# Patient Record
Sex: Male | Born: 1955 | Race: Black or African American | Hispanic: No | Marital: Single | Smoking: Current some day smoker
Health system: Southern US, Community
[De-identification: ages and names within clinical notes are randomized; demographics above are authoritative.]

## PROBLEM LIST (undated history)

## (undated) DIAGNOSIS — N23 Unspecified renal colic: Secondary | ICD-10-CM

## (undated) DIAGNOSIS — K219 Gastro-esophageal reflux disease without esophagitis: Secondary | ICD-10-CM

## (undated) DIAGNOSIS — K589 Irritable bowel syndrome without diarrhea: Secondary | ICD-10-CM

## (undated) DIAGNOSIS — M79609 Pain in unspecified limb: Principal | ICD-10-CM

## (undated) HISTORY — PX: KNEE SURGERY: SHX244

## (undated) HISTORY — PX: ANTERIOR CERVICAL DECOMP/DISCECTOMY FUSION: SHX1161

## (undated) HISTORY — DX: Pain in unspecified limb: M79.609

---

## 1999-10-26 ENCOUNTER — Other Ambulatory Visit: Admission: RE | Admit: 1999-10-26 | Discharge: 1999-10-26 | Payer: Self-pay | Admitting: Gastroenterology

## 2002-05-26 ENCOUNTER — Ambulatory Visit (HOSPITAL_BASED_OUTPATIENT_CLINIC_OR_DEPARTMENT_OTHER): Admission: RE | Admit: 2002-05-26 | Discharge: 2002-05-26 | Payer: Self-pay | Admitting: Urology

## 2006-02-28 ENCOUNTER — Ambulatory Visit (HOSPITAL_COMMUNITY): Admission: RE | Admit: 2006-02-28 | Discharge: 2006-02-28 | Payer: Self-pay | Admitting: Neurosurgery

## 2006-03-12 ENCOUNTER — Ambulatory Visit (HOSPITAL_COMMUNITY): Admission: RE | Admit: 2006-03-12 | Discharge: 2006-03-13 | Payer: Self-pay | Admitting: Neurosurgery

## 2006-05-09 ENCOUNTER — Encounter: Admission: RE | Admit: 2006-05-09 | Discharge: 2006-05-09 | Payer: Self-pay | Admitting: Neurosurgery

## 2010-12-15 ENCOUNTER — Emergency Department (HOSPITAL_COMMUNITY): Payer: BC Managed Care – PPO

## 2010-12-15 ENCOUNTER — Emergency Department (HOSPITAL_COMMUNITY)
Admission: EM | Admit: 2010-12-15 | Discharge: 2010-12-15 | Disposition: A | Discharge status: Home / Self Care | Payer: BC Managed Care – PPO | Attending: Emergency Medicine | Admitting: Emergency Medicine

## 2010-12-15 DIAGNOSIS — K219 Gastro-esophageal reflux disease without esophagitis: Secondary | ICD-10-CM | POA: Insufficient documentation

## 2010-12-15 DIAGNOSIS — R112 Nausea with vomiting, unspecified: Secondary | ICD-10-CM | POA: Insufficient documentation

## 2010-12-15 DIAGNOSIS — R109 Unspecified abdominal pain: Secondary | ICD-10-CM

## 2010-12-15 DIAGNOSIS — M549 Dorsalgia, unspecified: Secondary | ICD-10-CM | POA: Insufficient documentation

## 2010-12-15 DIAGNOSIS — K589 Irritable bowel syndrome without diarrhea: Secondary | ICD-10-CM | POA: Insufficient documentation

## 2010-12-15 HISTORY — DX: Irritable bowel syndrome without diarrhea: K58.9

## 2010-12-15 HISTORY — DX: Unspecified renal colic: N23

## 2010-12-15 HISTORY — DX: Gastro-esophageal reflux disease without esophagitis: K21.9

## 2010-12-15 LAB — CBC
HCT: 38.1 % — ABNORMAL LOW (ref 39.0–52.0)
Hemoglobin: 12.9 g/dL — ABNORMAL LOW (ref 13.0–17.0)
MCHC: 33.9 g/dL (ref 30.0–36.0)
MCV: 100.5 fL — ABNORMAL HIGH (ref 78.0–100.0)
Platelets: 230 10*3/uL (ref 150–400)
RBC: 3.79 MIL/uL — ABNORMAL LOW (ref 4.22–5.81)
RDW: 12.9 % (ref 11.5–15.5)
WBC: 8.5 10*3/uL (ref 4.0–10.5)

## 2010-12-15 LAB — DIFFERENTIAL
Basophils Absolute: 0.1 10*3/uL (ref 0.0–0.1)
Eosinophils Absolute: 0.4 10*3/uL (ref 0.0–0.7)
Eosinophils Relative: 4 % (ref 0–5)
Monocytes Absolute: 0.6 10*3/uL (ref 0.1–1.0)
Monocytes Relative: 7 % (ref 3–12)
Neutrophils Relative %: 60 % (ref 43–77)

## 2010-12-15 LAB — URINALYSIS, ROUTINE W REFLEX MICROSCOPIC
Bilirubin Urine: NEGATIVE
Glucose, UA: NEGATIVE mg/dL
Leukocytes, UA: NEGATIVE
Specific Gravity, Urine: 1.016 (ref 1.005–1.030)
pH: 5.5 (ref 5.0–8.0)

## 2010-12-15 LAB — BASIC METABOLIC PANEL
CO2: 25 mEq/L (ref 19–32)
Calcium: 8.9 mg/dL (ref 8.4–10.5)
Chloride: 104 mEq/L (ref 96–112)
GFR calc non Af Amer: >90 mL/min (ref >90)
Glucose, Bld: 97 mg/dL (ref 70–99)
Potassium: 3.6 mEq/L (ref 3.5–5.1)

## 2010-12-15 LAB — URINE MICROSCOPIC-ADD ON

## 2010-12-15 MED ORDER — FAMOTIDINE 20 MG PO TABS
20.0000 mg | ORAL_TABLET | Freq: Once | ORAL | Status: AC
  Administered 2010-12-15: 20 mg via ORAL
  Filled 2010-12-15: qty 1

## 2010-12-15 MED ORDER — OXYCODONE-ACETAMINOPHEN 5-325 MG PO TABS: 1.0000 | ORAL_TABLET | ORAL | Status: AC | PRN

## 2010-12-15 MED ORDER — KETOROLAC TROMETHAMINE 30 MG/ML IJ SOLN
30.0000 mg | Freq: Once | INTRAMUSCULAR | Status: AC
  Administered 2010-12-15: 30 mg via INTRAVENOUS
  Filled 2010-12-15: qty 1

## 2010-12-15 MED ORDER — ONDANSETRON HCL 4 MG/2ML IJ SOLN
4.0000 mg | Freq: Once | INTRAMUSCULAR | Status: AC
  Administered 2010-12-15: 4 mg via INTRAVENOUS
  Filled 2010-12-15: qty 2

## 2010-12-15 MED ORDER — MORPHINE SULFATE 4 MG/ML IJ SOLN
4.0000 mg | Freq: Once | INTRAMUSCULAR | Status: AC
  Administered 2010-12-15: 4 mg via INTRAVENOUS
  Filled 2010-12-15: qty 1

## 2010-12-15 MED ORDER — HYDROMORPHONE HCL PF 2 MG/ML IJ SOLN
INTRAMUSCULAR | Status: AC
  Administered 2010-12-15: 1 mg
  Filled 2010-12-15: qty 1

## 2010-12-15 MED ORDER — PROMETHAZINE HCL 25 MG PO TABS: 25.0000 mg | ORAL_TABLET | Freq: Four times a day (QID) | ORAL | Status: AC | PRN

## 2010-12-15 NOTE — ED Notes (Signed)
Patient states that since Sunday he has been having right lower quadrant/flank pain. He went to pcp office and was sent home with percocet. He has been taking this with no relief. Last dose of pain med was at 0700. Pt called pcp and he was sent in here for evaluation of persistent right abd pain and flank pain. Pt called [facility] yesterday but they were unable to see him. The patient tried to wait the night out and was going to call office again this morning but he couldn't tolerate the pain.

## 2010-12-15 NOTE — ED Notes (Signed)
Patient transported to CT 

## 2010-12-15 NOTE — ED Provider Notes (Signed)
Medical screening examination/treatment/procedure(s) were performed by non-physician practitioner and as supervising physician I was immediately available for consultation/collaboration.   Barnell Shieh, MD 12/15/10 1321 

## 2010-12-15 NOTE — ED Notes (Signed)
See triage note. Pt started to have pain on Sunday. He went to pcp office who did a renal colic work up with a 2-view kub that did not show an obvious kidney stone. He was sent home with percocet and tried to get into [facility] yesterday but they were full. He tried to get through the night but this morning his right lower quadrant abdominal/flank pain was worse. Denies any vomiting although he has been nauseated at times. Iv started and labs obtained. Protocols initiated. Family at the bedside. Pt states that the last kidney stone was passed and did not require stents or lithotripsy.

## 2010-12-15 NOTE — ED Provider Notes (Signed)
History     CSN: [id number] Arrival date & time: 12/15/2010  8:15 AM   None     Chief Complaint  Patient presents with  . Abdominal Pain  . Flank Pain   HPI Patient presents to emergency room with complaint of 2 days of right sided flank pain that radiates into his back. Reports that it also radiates into his groin. Denies any trauma or falls. Reports some nausea without vomiting. Denies any fevers. Reports a past medical history significant for kidney stone seen by Dr. Fayrene Helper in the past. Patient reports that he has had to have surgery secondary to kidney stones. Patient denies any other complaints.  Past Medical History  Diagnosis Date  . Renal colic   . IBS (irritable bowel syndrome)   . Acid reflux     Past Surgical History  Procedure Date  . Anterior cervical decomp/discectomy fusion   . Knee surgery     History reviewed. No pertinent family history.  History  Substance Use Topics  . Smoking status: Current Some Day Smoker  . Smokeless tobacco: Never Used  . Alcohol Use: 1.8 oz/week    3 Cans of beer per week      Review of Systems  Constitutional: Negative for fever, chills, diaphoresis and appetite change.  HENT: Negative for neck pain.   Eyes: Negative for photophobia and visual disturbance.  Respiratory: Negative for cough, chest tightness and shortness of breath.   Cardiovascular: Negative for chest pain.  Gastrointestinal: Positive for nausea and abdominal pain. Negative for vomiting, diarrhea, constipation, blood in stool and anal bleeding.  Genitourinary: Negative for dysuria, urgency, flank pain, decreased urine volume, discharge, penile swelling, scrotal swelling, difficulty urinating, penile pain and testicular pain.  Musculoskeletal: Negative for back pain.  Skin: Negative for rash.  Neurological: Negative for weakness and numbness.  All other systems reviewed and are negative.    Allergies  Aspirin and Shellfish allergy  Home Medications    No current outpatient prescriptions on file.  BP 138/84  Pulse 64  Temp(Src) 98.5 F (36.9 C) (Oral)  SpO2 100%  Physical Exam  Nursing note and vitals reviewed. Constitutional: He is oriented to person, place, and time. He appears well-developed and well-nourished. No distress.  HENT:  Head: Normocephalic and atraumatic.  Eyes: EOM are normal. Pupils are equal, round, and reactive to light.  Neck: Normal range of motion. Neck supple.  Abdominal: Bowel sounds are normal. He exhibits no distension and no mass. There is no rebound and no guarding. Hernia confirmed negative in the right inguinal area and confirmed negative in the left inguinal area.       Minimal cva and right flank pain  Genitourinary: Penis normal. Right testis shows no swelling and no tenderness. Left testis shows no swelling and no tenderness. Circumcised. No penile tenderness.       Chaperone present during examination.  Musculoskeletal: Normal range of motion.  Neurological: He is alert and oriented to person, place, and time.  Skin: Skin is dry. He is not diaphoretic.  Psychiatric: He has a normal mood and affect. His behavior is normal. Judgment and thought content normal.    ED Course  Procedures (including critical care time)  Patient seen and evaluated.  VSS reviewed. . Nursing notes reviewed. Discussed with attending physician, dr. Manus Gunning. Initial testing ordered. Will monitor the patient closely. They agree with the treatment plan and diagnosis.   Results for orders placed during the hospital encounter of 12/15/10  BASIC METABOLIC PANEL  Component Value Range   Sodium 140  135 - 145 (mEq/L)   Potassium 3.6  3.5 - 5.1 (mEq/L)   Chloride 104  96 - 112 (mEq/L)   CO2 25  19 - 32 (mEq/L)   Glucose, Bld 97  70 - 99 (mg/dL)   BUN 7  6 - 23 (mg/dL)   Creatinine, Ser 1.61  0.50 - 1.35 (mg/dL)   Calcium 8.9  8.4 - 09.6 (mg/dL)   GFR calc non Af Amer >90  >90 (mL/min)   GFR calc Af Amer >90  >90  (mL/min)  CBC      Component Value Range   WBC 8.5  4.0 - 10.5 (K/uL)   RBC 3.79 (*) 4.22 - 5.81 (MIL/uL)   Hemoglobin 12.9 (*) 13.0 - 17.0 (g/dL)   HCT 04.5 (*) 40.9 - 52.0 (%)   MCV 100.5 (*) 78.0 - 100.0 (fL)   MCH 34.0  26.0 - 34.0 (pg)   MCHC 33.9  30.0 - 36.0 (g/dL)   RDW 81.1  91.4 - 78.2 (%)   Platelets 230  150 - 400 (K/uL)  DIFFERENTIAL      Component Value Range   Neutrophils Relative 60  43 - 77 (%)   Neutro Abs 5.1  1.7 - 7.7 (K/uL)   Lymphocytes Relative 29  12 - 46 (%)   Lymphs Abs 2.4  0.7 - 4.0 (K/uL)   Monocytes Relative 7  3 - 12 (%)   Monocytes Absolute 0.6  0.1 - 1.0 (K/uL)   Eosinophils Relative 4  0 - 5 (%)   Eosinophils Absolute 0.4  0.0 - 0.7 (K/uL)   Basophils Relative 1  0 - 1 (%)   Basophils Absolute 0.1  0.0 - 0.1 (K/uL)  URINALYSIS, ROUTINE W REFLEX MICROSCOPIC      Component Value Range   Color, Urine YELLOW  YELLOW    APPearance CLEAR  CLEAR    Specific Gravity, Urine 1.016  1.005 - 1.030    pH 5.5  5.0 - 8.0    Glucose, UA NEGATIVE  NEGATIVE (mg/dL)   Hgb urine dipstick SMALL (*) NEGATIVE    Bilirubin Urine NEGATIVE  NEGATIVE    Ketones, ur NEGATIVE  NEGATIVE (mg/dL)   Protein, ur NEGATIVE  NEGATIVE (mg/dL)   Urobilinogen, UA 0.2  0.0 - 1.0 (mg/dL)   Nitrite NEGATIVE  NEGATIVE    Leukocytes, UA NEGATIVE  NEGATIVE   URINE MICROSCOPIC-ADD ON      Component Value Range   Squamous Epithelial / LPF RARE  RARE    WBC, UA 0-2  <3 (WBC/hpf)   RBC / HPF 0-2  <3 (RBC/hpf)   Ct Abdomen Pelvis Wo Contrast  12/15/2010  *RADIOLOGY REPORT*  Clinical Data: Abdominal and right-sided flank pain.  CT ABDOMEN AND PELVIS WITHOUT CONTRAST  Technique:  Multidetector CT imaging of the abdomen and pelvis was performed following the standard protocol without intravenous contrast.  Comparison: None.  Findings:  The lack of intravenous contrast limits ability to evaluate solid abdominal organs.  Normal hepatic contour.  Normal noncontrast appearance of the  gallbladder.  No ascites.  The noncontrast appearance of the bilateral kidneys is normal.  No renal stones.  The urinary bladder is normal for degree of distension.  No evidence of hydronephrosis.  No perinephric stranding.  The noncontrast appearance of the bilateral adrenal glands, pancreas and spleen is normal.  Moderate colonic stool burden without evidence of obstruction.  No bowel wall thickening.  Normal noncontrast appearance of  the appendix.  No pneumoperitoneum, pneumatosis or portal venous gas. Scattered minimal atherosclerotic calcifications of a normal caliber abdominal aorta.  No retroperitoneal, mesenteric, pelvic or inguinal lymph adenopathy.  Limited visualization of the lower thorax demonstrates bibasilar dependent atelectasis.  No focal airspace opacity.  No pleural effusion or pneumothorax.  Normal heart size.  No pericardial effusion.  No acute or aggressive osseous abnormalities. Note is made of a small right-sided mesenteric fat containing inguinal hernia.  IMPRESSION:  No explanation for the patient's right sided flank and abdominal pain.  Specifically, no evidence of nephrolithiasis or urinary obstruction.  Normal noncontrast appearance of the appendix.  Original Report Authenticated By: Waynard Reeds, M.D.     Patient seen and re-evaluated. Resting comfortably. VSS stable. NAD. Patient notified of testing results. Stated agreement and understanding. Patient stated understanding to treatment plan and diagnosis. Patient resting comfortably. In NAD.   MDM  Right flank pain, possible passage of stone or small kidney stone not seen on CT scan. D/w dr. Manus Gunning. Pain controlled at this time. Advised patien tof warning signs to return. Stated agreement and understanding.         Demetrius Charity, PA 12/15/10 1100

## 2012-12-19 ENCOUNTER — Ambulatory Visit (INDEPENDENT_AMBULATORY_CARE_PROVIDER_SITE_OTHER): Payer: BC Managed Care – PPO

## 2012-12-19 DIAGNOSIS — R209 Unspecified disturbances of skin sensation: Secondary | ICD-10-CM
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
## 2012-12-25 ENCOUNTER — Encounter: Payer: Self-pay | Admitting: Neurology

## 2012-12-26 ENCOUNTER — Encounter: Payer: Self-pay | Admitting: Neurology

## 2012-12-26 ENCOUNTER — Encounter (INDEPENDENT_AMBULATORY_CARE_PROVIDER_SITE_OTHER): Payer: Self-pay

## 2012-12-26 ENCOUNTER — Ambulatory Visit (INDEPENDENT_AMBULATORY_CARE_PROVIDER_SITE_OTHER): Payer: BC Managed Care – PPO | Admitting: Neurology

## 2012-12-26 VITALS — BP 158/98 | HR 68 | Ht 70.25 in | Wt 235.0 lb

## 2012-12-26 DIAGNOSIS — M79609 Pain in unspecified limb: Secondary | ICD-10-CM

## 2012-12-26 HISTORY — DX: Pain in unspecified limb: M79.609
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
## 2012-12-26 NOTE — Patient Instructions (Signed)
Follow up for the EMG only.

## 2012-12-30 ENCOUNTER — Encounter: Payer: Self-pay | Admitting: Neurology

## 2012-12-30 ENCOUNTER — Ambulatory Visit (INDEPENDENT_AMBULATORY_CARE_PROVIDER_SITE_OTHER): Payer: BC Managed Care – PPO | Admitting: Neurology

## 2012-12-30 VITALS — BP 158/93 | HR 73 | Wt 234.0 lb

## 2012-12-30 DIAGNOSIS — M79609 Pain in unspecified limb: Secondary | ICD-10-CM

## 2012-12-30 DIAGNOSIS — G542 Cervical root disorders, not elsewhere classified: Secondary | ICD-10-CM
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
## 2013-01-15 ENCOUNTER — Other Ambulatory Visit: Payer: BC Managed Care – PPO

## 2013-01-20 ENCOUNTER — Ambulatory Visit
Admission: RE | Admit: 2013-01-20 | Discharge: 2013-01-20 | Disposition: A | Discharge status: Home / Self Care | Payer: BC Managed Care – PPO | Source: Ambulatory Visit | Attending: Neurology | Admitting: Neurology

## 2013-01-20 DIAGNOSIS — M79609 Pain in unspecified limb: Secondary | ICD-10-CM

## 2013-01-20 DIAGNOSIS — G542 Cervical root disorders, not elsewhere classified: Secondary | ICD-10-CM

## 2013-01-20 MED ORDER — GADOBENATE DIMEGLUMINE 529 MG/ML IV SOLN
20.0000 mL | Freq: Once | INTRAVENOUS | Status: AC | PRN
  Administered 2013-01-20: 20 mL via INTRAVENOUS

## 2013-01-21 ENCOUNTER — Telehealth: Payer: Self-pay | Admitting: Neurology

## 2013-01-21 NOTE — Telephone Encounter (Signed)
I called the patient. The MRI of the cervical spine does not show compression of the right C5 nerve root. I would recommend a myelogram with CT to follow. If he is OK with this, he is to call us.

## 2013-04-17 DIAGNOSIS — Z0289 Encounter for other administrative examinations: Secondary | ICD-10-CM

## 2015-05-11 ENCOUNTER — Ambulatory Visit (INDEPENDENT_AMBULATORY_CARE_PROVIDER_SITE_OTHER): Payer: BLUE CROSS/BLUE SHIELD | Admitting: Pulmonary Disease

## 2015-05-11 ENCOUNTER — Encounter: Payer: Self-pay | Admitting: Pulmonary Disease

## 2015-05-11 VITALS — BP 142/90 | HR 66 | Temp 98.5°F | Ht 70.0 in | Wt 231.8 lb

## 2015-05-11 DIAGNOSIS — Z9189 Other specified personal risk factors, not elsewhere classified: Secondary | ICD-10-CM

## 2015-05-11 DIAGNOSIS — Z8709 Personal history of other diseases of the respiratory system: Secondary | ICD-10-CM | POA: Diagnosis not present

## 2015-05-11 DIAGNOSIS — E663 Overweight: Secondary | ICD-10-CM

## 2015-05-11 DIAGNOSIS — Z77090 Contact with and (suspected) exposure to asbestos: Secondary | ICD-10-CM

## 2015-05-11 DIAGNOSIS — Z720 Tobacco use: Secondary | ICD-10-CM

## 2015-05-11 DIAGNOSIS — F17200 Nicotine dependence, unspecified, uncomplicated: Secondary | ICD-10-CM

## 2015-05-11 DIAGNOSIS — J683 Other acute and subacute respiratory conditions due to chemicals, gases, fumes and vapors: Secondary | ICD-10-CM | POA: Insufficient documentation

## 2015-05-11 DIAGNOSIS — R0600 Dyspnea, unspecified: Secondary | ICD-10-CM

## 2015-05-11 NOTE — Progress Notes (Signed)
Subjective:     Patient ID: Shaun Hunter, male   DOB: 06/13/1955, 60 y.o.   MRN: [id number]  HPI ~  May 11, 2015:  Initial Pulmonary Consult by SN>   50 y/o BM referred by Shaun Soho PA at [facility] for a pulmonary evaluation>      Pt recently presented to his PCP office c/o 9mo hx of SOB/DOE starting in Jan2017 after a URI/ chest cold that was slow to resolve;  He first noted dry cough, low grade fever and chills, nasal congestion & drainage that lasted 3-4d and slowly resolved on it's own;  He noted some SOB/ DOE when walking to his car or going to the store & this was assoc w/ some heavy breathing;  He is somewhat vague about the onset & character of the dyspnea> states that in 2016 he played golf regularly & some tennis as well w/o difficulty;  Then in 2017 he finds that he has to ride the golf course & when he carries his bag from the car that he usually has to stop along the way;  He says his chest feels sl tight & he may notice some wheezing "like a mild case of asthma";  His PCP gave him an Albuterol inhaler for prn use & the pt credits this intervention w/ his recent improvement in SOB over the last month- he states that his shortness of breath is pretty much gone now.     Shaun Hunter says that he was diagnosed w/ asthma as a child but "grew out of it" by age 67;  He recalls being very SOB during the attacks "like I was going to die" but he cannot recall his triggers;  He thinks that he prob has allergies to pollen & ragweed but was never allergy tested;  He has never had pneumonia, does not recall ever being treated for bronchitis, and has no hx of TB or known exposure;  He smokes "mini cigars- but only 1/d on ave" he says;  He is a former cigarette smoker, starting at age 30, smoked up to 1/2ppd for 30 yrs and quit cigarettes in 2007;  He has been employed in the [employer], and as a Landscape architect, but then 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    He does note a hx snoring and his girlfriend notes that he stops breathing as well;  He mentioned to me that his PCP at [facility] was planning to do a sleep study and refer him to their sleep specialist soon...    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    Current Meds>  Albuterol HFA inhaler for prn use, no other regular meds...  EXAM shows Afeb, VSS, O2sat=98% on RA at rest;  HEENT- neg, mallampati2;  Chest- clear w/o m/r/g;  Heart- RR w/o m/r/g;  Abd- soft nontender, neg;  Ext- neg w/o c/c/e;  Neuro- intact w/o focal deficits...   CXR 04/08/15 showed ?tiny nodule in RUL (poss a vessel on end), mild basilar atx,  sl increase in the lateral pleural strpe, borderline heart size/ no CHF, NAD...  Spirometry 05/11/15>  FVC=4.18 (102%), FEV1=3.24 (101%), %1sec=78%, mid-flows are at lower edge of normal 79% predicted...   Ambulatory Oximetry 05/11/15>  O2sat=100% on RA at rest w/ pulse=89/min;  He walked 3 laps in office w/ lowest O2sat=98% w/ pulse=92/min...  CT Chest => 05/14/15 showed norm heart size, LNs wnl in size, lungs are essentially clear, no suspicious pulm nodules, mild paraseptal emphysema notedNAD (Cspine hardware noted)...   IMP >>     Hx dyspnea on exertion>  The hx suggests asthmatic bronchitis/ RADS after his URI in Jan2017; he has improved nicely on the prn PROAIR-HFA inhaler; PFTs now look wnl as they should betw asthma attacks & I do not feel that we need to do a methacholine challenge etc;  rec to continue the Albut inhaler prn for now...     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    Hx asbestos exposure>  No signs of asbestosis; he needs to quit all smoking & a yearly f/u CXR would be in order...    R/O OSA>  He needs sleep study evaluation...    Medical issues>  Overweight, GERD, IBS, hx colitis, hx kidney stone, DJD w/ knee & neck surg... PLAN >>     Shaun Hunter is already feeling better on the Albuterol HFA rescue inhaler prescribed by [facility]arton and his PFT is essentially wnl now;  His CXR shows a subtle increase in the lateral pleural stripe and the radiologist raised the ? 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     Past Medical History  Diagnosis Date  . Renal colic   . IBS (irritable bowel syndrome)   . Acid reflux   . Pain in limb 12/26/2012    Past Surgical History  Procedure Laterality Date  . Anterior cervical decomp/discectomy fusion    . Knee surgery      Outpatient Encounter Prescriptions as of 05/11/2015  Medication Sig  . albuterol (PROVENTIL HFA;VENTOLIN HFA) 108 (90 Base) MCG/ACT inhaler Inhale 2 puffs into the lungs every 6 (six) hours as needed for wheezing or shortness of breath.  . [DISCONTINUED] albuterol (PROVENTIL HFA;VENTOLIN HFA) 108 (90 Base) MCG/ACT inhaler Inhale 2 puffs into the lungs every 6 (six) hours as needed for wheezing or shortness of breath.   No facility-administered encounter medications on file as of 05/11/2015.    Allergies  Allergen Reactions  . Aspirin Nausea Only  . Shellfish Allergy Hives    Shrimp only    Family History  Problem Relation Age of Onset  . Heart  disease Mother   . Cancer - Lung Father   . Cancer - Prostate Brother     AAA    Social History   Social History  . Marital Status: Single    Spouse Name: N/A  . Number of Children: N/A  . Years of Education: N/A   Occupational  History  . Not on file.   Social History Main Topics  . Smoking status: Current Some Day Smoker    Types: Cigars  . Smokeless tobacco: Never Used  . Alcohol Use: 1.8 oz/week    3 Cans of beer per week  . Drug Use: No  . Sexual Activity: Not on file   Other Topics Concern  . Not on file   Social History Narrative    Current Medications, Allergies, Past Medical History, Past Surgical History, Family History, and Social History were reviewed in [employer] record.   Review of Systems             All symptoms NEG except where BOLDED >>  Constitutional:  F/C/S, fatigue, anorexia, unexpected weight change. HEENT:  HA, visual changes, hearing loss, earache, nasal symptoms, sore throat, mouth sores, hoarseness. Resp:  cough, sputum, hemoptysis; SOB, tightness, wheezing. Cardio:  CP, palpit, DOE, orthopnea, edema. GI:  N/V/D/C, blood in stool; reflux, abd pain, distention, gas. GU:  dysuria, freq, urgency, hematuria, flank pain, voiding difficulty. MS:  joint pain, swelling, tenderness, decr ROM; neck pain, back pain, etc. Neuro:  HA, tremors, seizures, dizziness, syncope, weakness, numbness, gait abn. Skin:  suspicious lesions or skin rash. Heme:  adenopathy, bruising, bleeding. Psyche:  confusion, agitation, sleep disturbance, hallucinations, anxiety, depression suicidal.   Objective:   Physical Exam       Vital Signs:  Reviewed...  General:  WD, overweight, 60 y/o BM in NAD; alert & oriented; pleasant & cooperative... HEENT:  [place]/AT; Conjunctiva- pink, Sclera- nonicteric, EOM-wnl, PERRLA, EACs-clear, TMs-wnl; NOSE-clear; THROAT-clear & wnl. Neck:  Supple w/ decr ROM s/p neck surg; no JVD; normal carotid impulses w/o  bruits; no thyromegaly or nodules palpated; no lymphadenopathy. Chest:  Clear to P & A; without wheezes, rales, or rhonchi heard. Heart:  Regular Rhythm; norm S1 & S2 without murmurs, rubs, or gallops detected. Abdomen:  Soft & nontender- no guarding or rebound; normal bowel sounds; no organomegaly or masses palpated. Ext:  decr ROM; without deformities +arthritic changes; no varicose veins, venous insuffic, or edema;  Pulses intact w/o bruits. Neuro:  CNs II-XII intact; motor testing normal; sensory testing normal; gait normal & balance OK. Derm:  No lesions noted; no rash etc. Lymph:  No cervical, supraclavicular, axillary, or inguinal adenopathy palpated.   Assessment:      IMP >>     Hx dyspnea on exertion>  The hx suggests asthmatic bronchitis/ RADS after his URI in Jan2017; he has improved nicely on the prn PROAIR-HFA inhaler; PFTs now look wnl as they should betw asthma attacks & I do not feel that we need to do a methacholine challenge etc;  rec to continue the Albut inhaler prn for now...     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    Hx asbestos exposure>  No signs of asbestosis; he needs to quit all smoking & a yearly f/u CXR would be in order...    R/O OSA>  He needs sleep study evaluation...    Medical issues>  Overweight, GERD, IBS, hx colitis, hx kidney stone, DJD w/ knee & neck surg...  PLAN >>     Shaun Hunter is already feeling better on the Albuterol HFA rescue inhaler prescribed by [facility] and his PFT is essentially wnl now;  His  CXR shows a subtle increase in the lateral pleural stripe and the radiologist raised the ? 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      Plan:     Patient's Medications  New Prescriptions   No medications on file  Previous Medications   ALBUTEROL (PROVENTIL HFA;VENTOLIN HFA) 108 (90 BASE) MCG/ACT INHALER    Inhale 2 puffs into the lungs every 6 (six) hours as needed for wheezing or shortness of breath.  Modified Medications   No medications on file  Discontinued Medications   ALBUTEROL (PROVENTIL HFA;VENTOLIN HFA) 108 (90 BASE) MCG/ACT INHALER    Inhale 2 puffs into the lungs every 6 (six) hours as needed for wheezing or shortness of breath.

## 2015-05-11 NOTE — Patient Instructions (Signed)
[name]-- it was great meeting you today...  Today we checked a spirometry breathing test and an ambulatory oximetyry test... We reviewed your recent CXR as well...    We decided to check a CT scan of your chest for completeness...       We will contact you w/ the results when available...   In the meanwhile-- you are already improved w/ the ALBUTEROL-HFA rescue inhaler given by your PCP...    Continue to carry this with you & use it 1-2 inhalations every 4-6H AS NEEDED for wheezing or shortness of breath...  You must quit the last of the smoking!!!    Especially in relation to your asbestos exposure...  Increase your exercise program and work on weight reduction-- it will help your breathing & stamina...  Proceed w/ the SLEEP Study that your PCP mentioned to you-- if needed the CPAP can really help your resting AND your daytime energy level...  Call for any questions or if I can be of assistance in any way!

## 2015-05-14 ENCOUNTER — Ambulatory Visit (INDEPENDENT_AMBULATORY_CARE_PROVIDER_SITE_OTHER)
Admission: RE | Admit: 2015-05-14 | Discharge: 2015-05-14 | Disposition: A | Discharge status: Home / Self Care | Payer: BLUE CROSS/BLUE SHIELD | Source: Ambulatory Visit | Attending: Pulmonary Disease | Admitting: Pulmonary Disease

## 2015-05-14 DIAGNOSIS — R0600 Dyspnea, unspecified: Secondary | ICD-10-CM

## 2015-12-28 DIAGNOSIS — H524 Presbyopia: Secondary | ICD-10-CM | POA: Diagnosis not present

## 2016-11-24 IMAGING — CT CT CHEST W/O CM
2 of 3 series · 15 of 36 positions shown, 18 images · non-contrast
Comparison: Chest radiographs dated 04/08/2015

CLINICAL DATA: Shortness of breath, right upper lobe nodule chest
radiograph

EXAM:
CT CHEST WITHOUT CONTRAST
TECHNIQUE: Multidetector CT imaging of the chest was performed following the
standard protocol without IV contrast.

[Series 2: thorax · axial · 0.81mm/px · z∈[-263,-29]mm · 12 of 139 slices shown, 15 images]
[im 11/139  mediastinal]
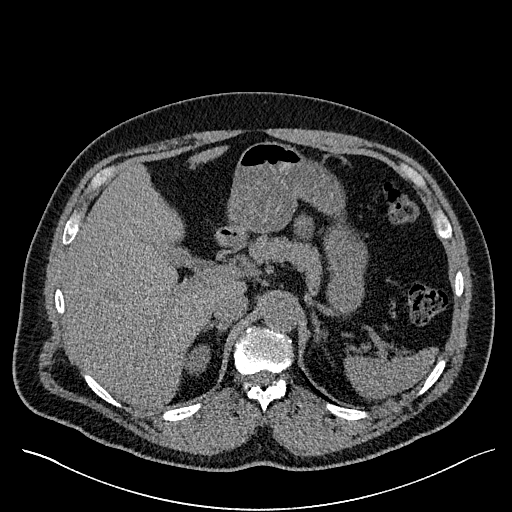
[im 11/139  lung]
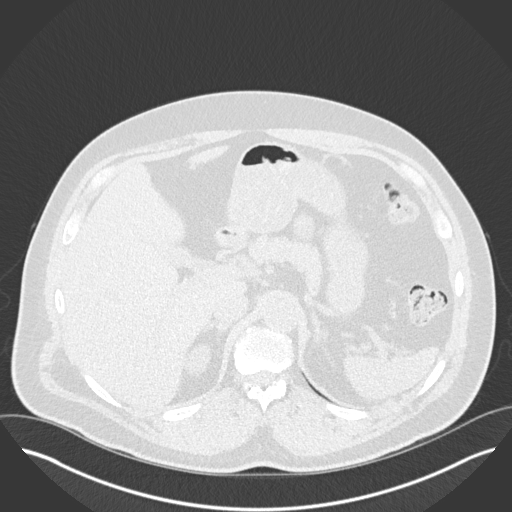
[im 21/139  lung]
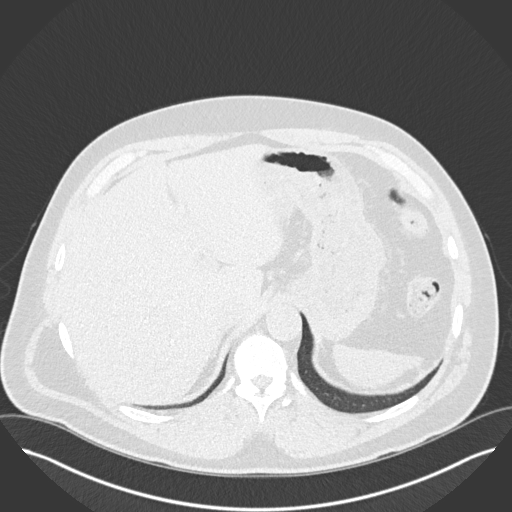
[im 31/139  lung]
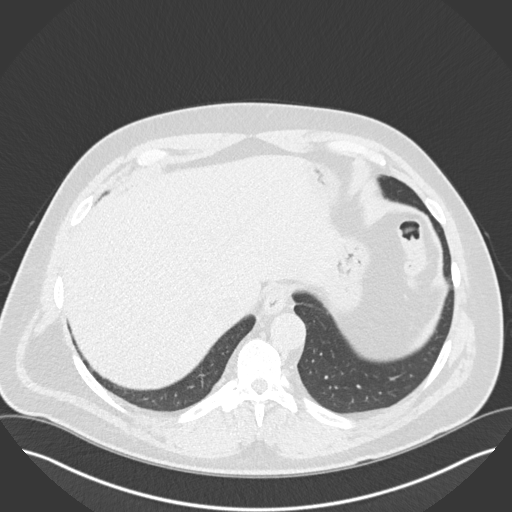
[im 41/139  lung]
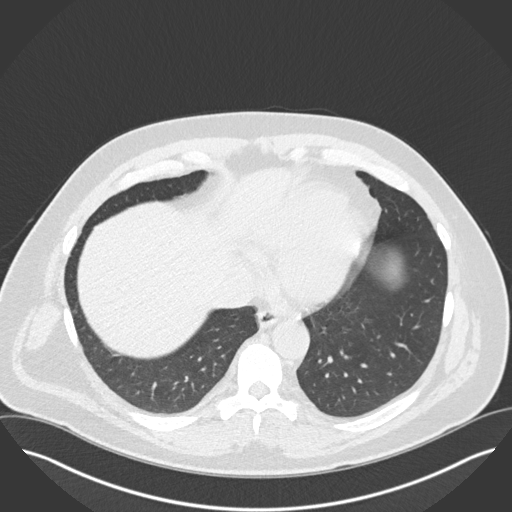
[im 52/139  mediastinal]
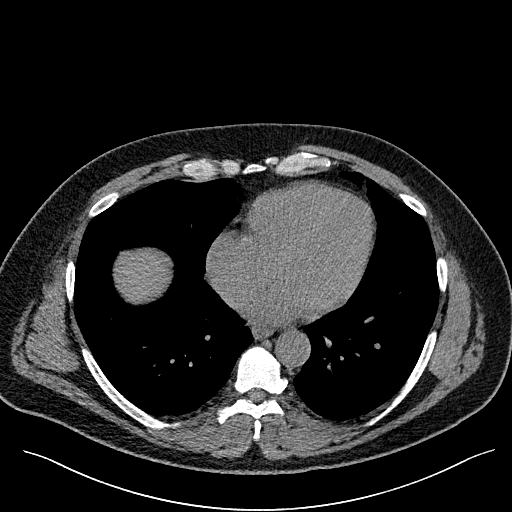
[im 52/139  lung]
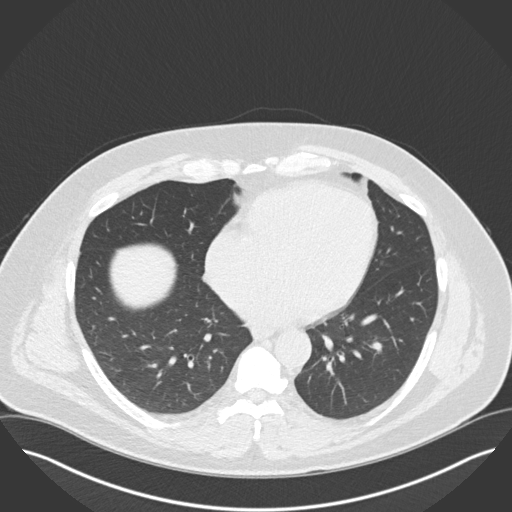
[im 62/139  lung]
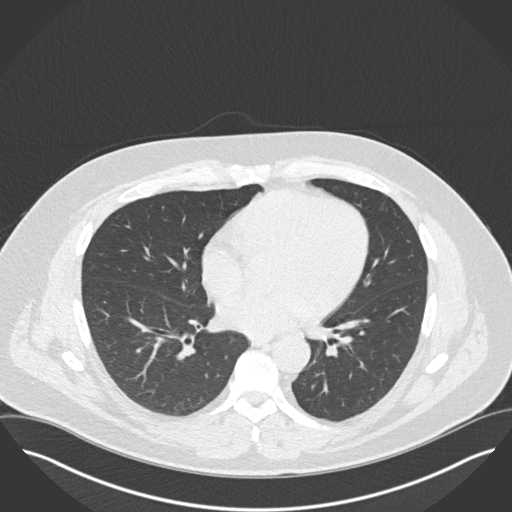
[im 77/139  lung]
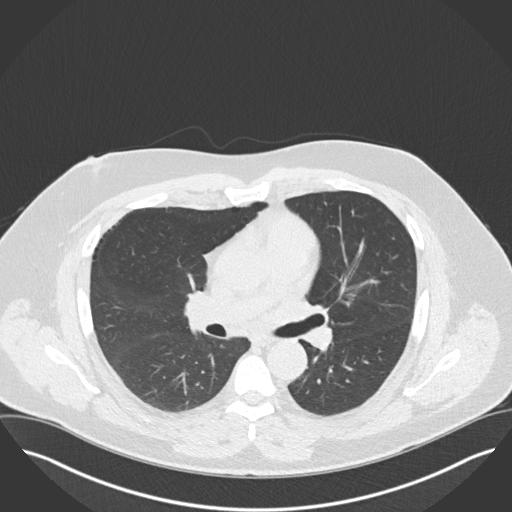
[im 87/139  lung]
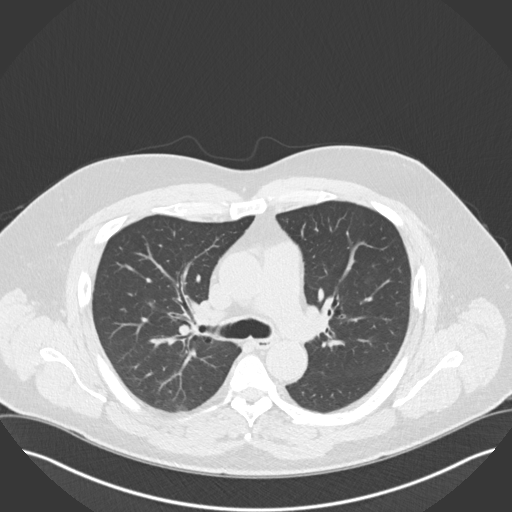
[im 98/139  mediastinal]
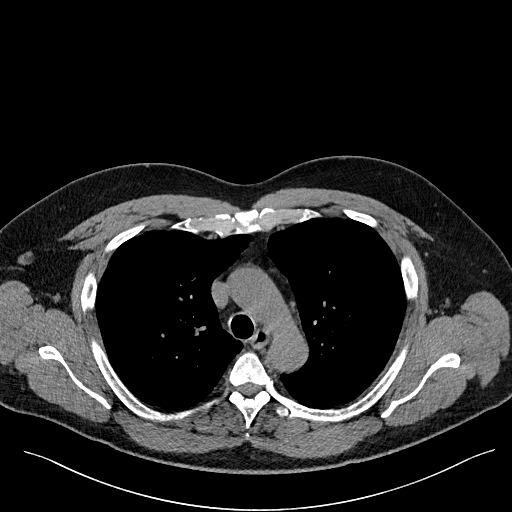
[im 98/139  lung]
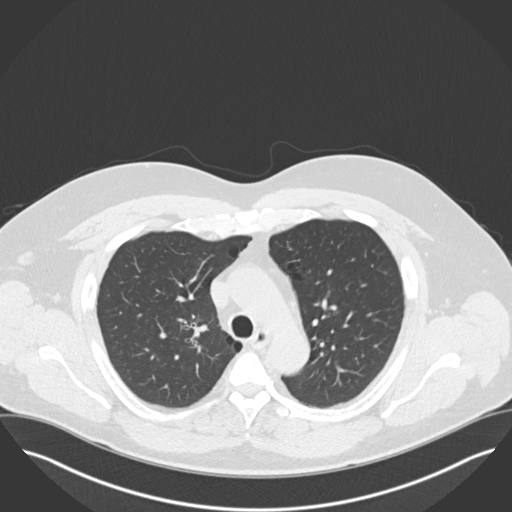
[im 108/139  lung]
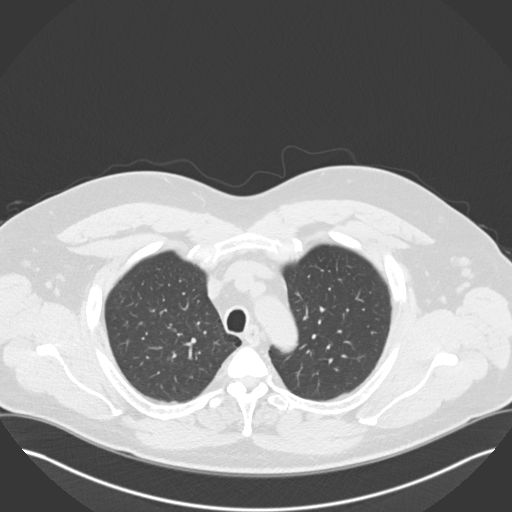
[im 118/139  lung]
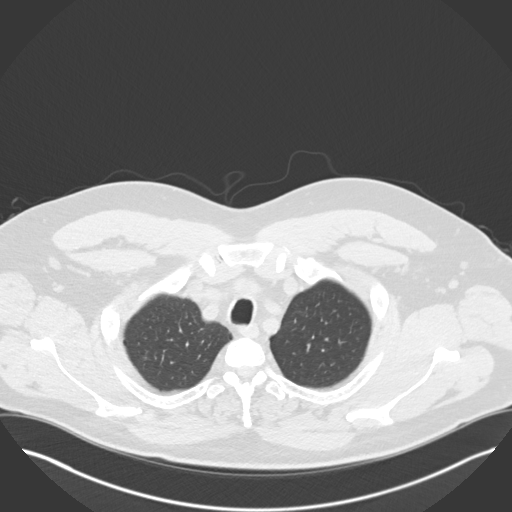
[im 128/139  lung]
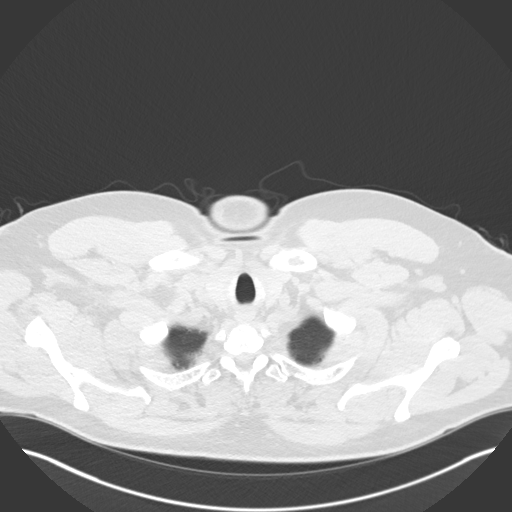

[Series 5: coronal · coronal · 0.59mm/px · 3 of 124 slices shown]
[im 25/124  lung]
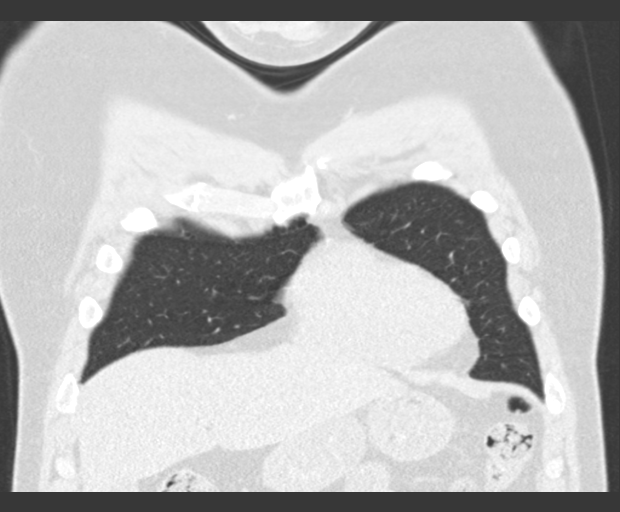
[im 50/124  lung]
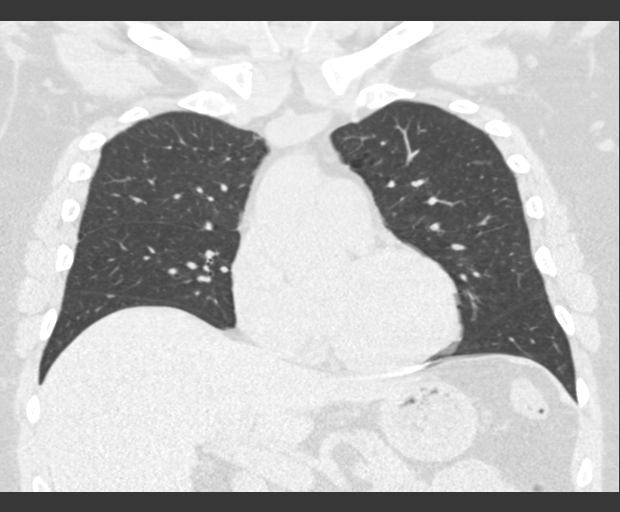
[im 74/124  lung]
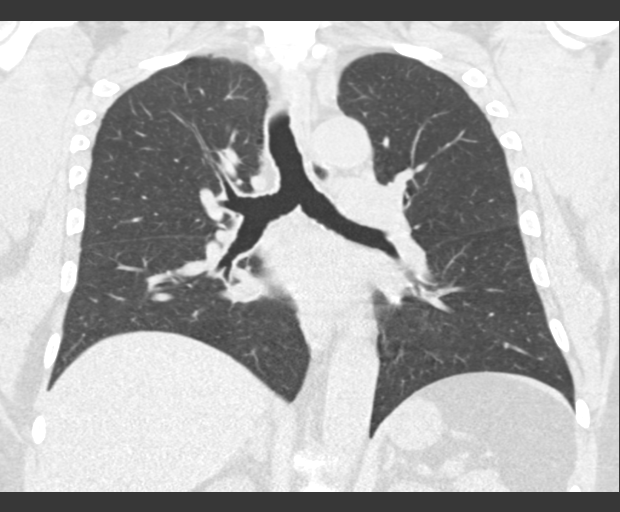

[15 of 36 positions shown; findings below may reference images not displayed]

FINDINGS: Mediastinum/Nodes: The heart is normal in size. No pericardial
effusion.

No evidence of thoracic aortic aneurysm.

Small mediastinal lymph nodes measuring up to 5 mm and small
bilateral axillary lymph nodes measuring up to 7 mm, within normal
limits.

Visualized thyroid is unremarkable.

Lungs/Pleura: Lungs are essentially clear.

No suspicious pulmonary nodules. Specifically, no right upper lobe
nodule to correspond to the suspected radiographic abnormality.

Mild paraseptal emphysematous changes.

No focal consolidation.

No pleural effusion or pneumothorax.

Upper abdomen: Visualized upper abdomen is unremarkable.

Musculoskeletal: Cervical spine fixation hardware, incompletely
visualized.

Mild degenerative changes of the lower thoracolumbar spine.
IMPRESSION: No evidence of acute cardiopulmonary disease.

Specifically, no right upper lobe pulmonary nodule to correspond to
the suspected radiographic finding.

## 2016-12-01 DIAGNOSIS — H524 Presbyopia: Secondary | ICD-10-CM | POA: Diagnosis not present

## 2016-12-22 DIAGNOSIS — H40009 Preglaucoma, unspecified, unspecified eye: Secondary | ICD-10-CM | POA: Diagnosis not present

## 2017-07-06 DIAGNOSIS — H40009 Preglaucoma, unspecified, unspecified eye: Secondary | ICD-10-CM | POA: Diagnosis not present

## 2017-11-08 DIAGNOSIS — R0789 Other chest pain: Secondary | ICD-10-CM | POA: Diagnosis not present

## 2017-11-08 DIAGNOSIS — I10 Essential (primary) hypertension: Secondary | ICD-10-CM | POA: Diagnosis not present

## 2017-12-22 DIAGNOSIS — H524 Presbyopia: Secondary | ICD-10-CM | POA: Diagnosis not present

## 2018-01-04 DIAGNOSIS — H40029 Open angle with borderline findings, high risk, unspecified eye: Secondary | ICD-10-CM | POA: Diagnosis not present

## 2018-04-11 DIAGNOSIS — I10 Essential (primary) hypertension: Secondary | ICD-10-CM | POA: Diagnosis not present

## 2018-04-11 DIAGNOSIS — R0789 Other chest pain: Secondary | ICD-10-CM | POA: Diagnosis not present

## 2019-01-28 DIAGNOSIS — H524 Presbyopia: Secondary | ICD-10-CM | POA: Diagnosis not present

## 2019-02-04 DIAGNOSIS — H40029 Open angle with borderline findings, high risk, unspecified eye: Secondary | ICD-10-CM | POA: Diagnosis not present

## 2019-04-10 DIAGNOSIS — I10 Essential (primary) hypertension: Secondary | ICD-10-CM | POA: Diagnosis not present

## 2019-04-11 ENCOUNTER — Ambulatory Visit: Payer: Self-pay | Attending: Internal Medicine

## 2019-04-11 DIAGNOSIS — Z23 Encounter for immunization: Secondary | ICD-10-CM

## 2019-04-11 NOTE — Progress Notes (Signed)
   Covid-19 Vaccination Clinic  Name:  JOTHAM AHN    MRN: [id number] DOB: 06/05/1955  04/11/2019  Mr. Haile was observed post Covid-19 immunization for 30 minutes based on pre-vaccination screening without incident. He was provided with Vaccine Information Sheet and instruction to access the V-Safe system.   Mr. Dise was instructed to call 911 with any severe reactions post vaccine: Marland Kitchen Difficulty breathing  . Swelling of face and throat  . A fast heartbeat  . A bad rash all over body  . Dizziness and weakness   Immunizations Administered    Name Date Dose VIS Date Route   Pfizer COVID-19 Vaccine 04/11/2019 12:55 AM 0.3 mL 12/27/2018 Intramuscular   Manufacturer: [employer], [employer]   Lot: [id number]   NDC: [address]-5

## 2019-05-06 ENCOUNTER — Ambulatory Visit: Payer: Self-pay | Attending: Internal Medicine

## 2019-05-06 DIAGNOSIS — Z23 Encounter for immunization: Secondary | ICD-10-CM

## 2019-05-06 NOTE — Progress Notes (Signed)
   Covid-19 Vaccination Clinic  Name:  Shaun Hunter    MRN: [id number] DOB: Oct 17, 1955  05/06/2019  Mr. Shaun Hunter was observed post Covid-19 immunization for 15 minutes without incident. He was provided with Vaccine Information Sheet and instruction to access the V-Safe system.   Mr. Shaun Hunter was instructed to call 911 with any severe reactions post vaccine: Marland Kitchen Difficulty breathing  . Swelling of face and throat  . A fast heartbeat  . A bad rash all over body  . Dizziness and weakness   Immunizations Administered    Name Date Dose VIS Date Route   Pfizer COVID-19 Vaccine 05/06/2019 12:22 PM 0.3 mL 03/12/2018 Intramuscular   Manufacturer: [employer], [employer]   Lot: [id number]   NDC: [address]-1

## 2019-08-09 DIAGNOSIS — H40003 Preglaucoma, unspecified, bilateral: Secondary | ICD-10-CM | POA: Diagnosis not present

## 2020-01-03 DIAGNOSIS — H40029 Open angle with borderline findings, high risk, unspecified eye: Secondary | ICD-10-CM | POA: Diagnosis not present

## 2021-03-09 ENCOUNTER — Ambulatory Visit (INDEPENDENT_AMBULATORY_CARE_PROVIDER_SITE_OTHER): Payer: No Typology Code available for payment source | Admitting: Podiatry

## 2021-03-09 ENCOUNTER — Encounter: Payer: Self-pay | Admitting: Podiatry

## 2021-03-09 ENCOUNTER — Other Ambulatory Visit: Payer: Self-pay

## 2021-03-09 ENCOUNTER — Ambulatory Visit (INDEPENDENT_AMBULATORY_CARE_PROVIDER_SITE_OTHER): Payer: No Typology Code available for payment source

## 2021-03-09 DIAGNOSIS — M779 Enthesopathy, unspecified: Secondary | ICD-10-CM | POA: Diagnosis not present

## 2021-03-09 DIAGNOSIS — M79671 Pain in right foot: Secondary | ICD-10-CM | POA: Diagnosis not present

## 2021-03-09 DIAGNOSIS — M79672 Pain in left foot: Secondary | ICD-10-CM | POA: Diagnosis not present

## 2021-03-09 DIAGNOSIS — M7671 Peroneal tendinitis, right leg: Secondary | ICD-10-CM | POA: Diagnosis not present

## 2021-03-09 MED ORDER — TRIAMCINOLONE ACETONIDE 10 MG/ML IJ SUSP
10.0000 mg | Freq: Once | INTRAMUSCULAR | Status: AC
  Administered 2021-03-09: 10 mg

## 2021-03-10 NOTE — Progress Notes (Signed)
Subjective:   Patient ID: Shaun Hunter, male   DOB: 66 y.o.   MRN: [id number]   HPI Patient states that he is developing pain in the outside of the right foot and this has been present for several months and he does not remember specific injury.  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      Objective:  Physical Exam Vitals and nursing note reviewed.  Constitutional:      Appearance: He is well-developed.  Pulmonary:     Effort: Pulmonary effort is normal.  Musculoskeletal:        General: Normal range of motion.  Skin:    General: Skin is warm.  Neurological:     Mental Status: He is alert.    Neurovascular status intact muscle strength adequate range of motion adequate with patient found to have quite a bit of inflammation pain around the fifth metatarsal insertion of the peroneal tendon with inflammation fluid buildup noted.  There is no muscle strength loss or other pathology     Assessment:  Possibility for acute peroneal tendinitis right does not appear to be related to bone structure or other bone pathology     Plan:  H&P reviewed condition and today I went ahead explained risk of injection did careful sterile prep and injected the sheath of the peroneal tendon at its insertion base of fifth metatarsal 3 mg dexamethasone Kenalog 5 mg Xylocaine and applied fascial brace to lift up the lateral side of the foot along with ice therapy.  Patient will be seen back to recheck if symptoms persist  X-rays were negative for signs of bony injury or indications of arthritis within the area of pain

## 2021-03-14 ENCOUNTER — Other Ambulatory Visit: Payer: Self-pay | Admitting: Podiatry

## 2021-03-14 DIAGNOSIS — M779 Enthesopathy, unspecified: Secondary | ICD-10-CM

## 2021-03-17 ENCOUNTER — Telehealth: Payer: No Typology Code available for payment source | Admitting: Physician Assistant

## 2021-03-17 DIAGNOSIS — J4521 Mild intermittent asthma with (acute) exacerbation: Secondary | ICD-10-CM

## 2021-03-17 DIAGNOSIS — J069 Acute upper respiratory infection, unspecified: Secondary | ICD-10-CM

## 2021-03-17 MED ORDER — ALBUTEROL SULFATE HFA 108 (90 BASE) MCG/ACT IN AERS: 2.0000 | INHALATION_SPRAY | Freq: Four times a day (QID) | RESPIRATORY_TRACT | 0 refills | Status: AC | PRN

## 2021-03-17 MED ORDER — BENZONATATE 100 MG PO CAPS: 100.0000 mg | ORAL_CAPSULE | Freq: Three times a day (TID) | ORAL | 0 refills | Status: AC | PRN

## 2021-03-17 NOTE — Progress Notes (Signed)
I have spent 5 minutes in review of e-visit questionnaire, review and updating patient chart, medical decision making and response to patient.   Candelaria Pies Cody Yarenis Cerino, PA-C    

## 2021-03-17 NOTE — Progress Notes (Signed)
E-Visit for Upper Respiratory Infection  ? ?We are sorry you are not feeling well.  Here is how we plan to help! ? ?Based on what you have shared with me, it looks like you may have a viral upper respiratory infection.  Upper respiratory infections are caused by a large number of viruses; however, rhinovirus is the most common cause.  ? ?Symptoms vary from person to person, with common symptoms including sore throat, cough, fatigue or lack of energy and feeling of general discomfort.  A low-grade fever of up to 100.4 may present, but is often uncommon.  Symptoms vary however, and are closely related to a person's age or underlying illnesses.  The most common symptoms associated with an upper respiratory infection are nasal discharge or congestion, cough, sneezing, headache and pressure in the ears and face.  These symptoms usually persist for about 3 to 10 days, but can last up to 2 weeks.  It is important to know that upper respiratory infections do not cause serious illness or complications in most cases.   ? ?Upper respiratory infections can be transmitted from person to person, with the most common method of transmission being a person's hands.  The virus is able to live on the skin and can infect other persons for up to 2 hours after direct contact.  Also, these can be transmitted when someone coughs or sneezes; thus, it is important to cover the mouth to reduce this risk.  To keep the spread of the illness at bay, good hand hygiene is very important. ? ?This is an infection that is most likely caused by a virus. There are no specific treatments other than to help you with the symptoms until the infection runs its course.  We are sorry you are not feeling well.  Here is how we plan to help! ? ? ?For nasal congestion, you may use an oral decongestants such as Mucinex D or if you have glaucoma or high blood pressure use plain Mucinex.  Saline nasal spray or nasal drops can help and can safely be used as often as  needed for congestion.  ? ?If you do not have a history of heart disease, hypertension, diabetes or thyroid disease, prostate/bladder issues or glaucoma, you may also use Sudafed to treat nasal congestion.  It is highly recommended that you consult with a pharmacist or your primary care physician to ensure this medication is safe for you to take.    ? ?If you have a cough, you may use cough suppressants such as Delsym and Robitussin.  If you have glaucoma or high blood pressure, you can also use Coricidin HBP.   ?For cough I have prescribed for you A prescription cough medication called Tessalon Perles 100 mg. You may take 1-2 capsules every 8 hours as needed for cough ? ?I have also sent in the albuterol inhaler for you to use as directed, when needed for chest tightness or wheezing.  ? ?If you have a sore or scratchy throat, use a saltwater gargle- ? to ? teaspoon of salt dissolved in a 4-ounce to 8-ounce glass of warm water.  Gargle the solution for approximately 15-30 seconds and then spit.  It is important not to swallow the solution.  You can also use throat lozenges/cough drops and Chloraseptic spray to help with throat pain or discomfort.  Warm or cold liquids can also be helpful in relieving throat pain. ? ?For headache, pain or general discomfort, you can use Ibuprofen or Tylenol as  directed.   ?Some authorities believe that zinc sprays or the use of Echinacea may shorten the course of your symptoms. ? ? ?HOME CARE ?Only take medications as instructed by your medical team. ?Be sure to drink plenty of fluids. Water is fine as well as fruit juices, sodas and electrolyte beverages. You may want to stay away from caffeine or alcohol. If you are nauseated, try taking small sips of liquids. How do you know if you are getting enough fluid? Your urine should be a pale yellow or almost colorless. ?Get rest. ?Taking a steamy shower or using a humidifier may help nasal congestion and ease sore throat pain. You can  place a towel over your head and breathe in the steam from hot water coming from a faucet. ?Using a saline nasal spray works much the same way. ?Cough drops, hard candies and sore throat lozenges may ease your cough. ?Avoid close contacts especially the very young and the elderly ?Cover your mouth if you cough or sneeze ?Always remember to wash your hands.  ? ?GET HELP RIGHT AWAY IF: ?You develop worsening fever. ?If your symptoms do not improve within 10 days ?You develop yellow or green discharge from your nose over 3 days. ?You have coughing fits ?You develop a severe head ache or visual changes. ?You develop shortness of breath, difficulty breathing or start having chest pain ?Your symptoms persist after you have completed your treatment plan ? ?MAKE SURE YOU  ?Understand these instructions. ?Will watch your condition. ?Will get help right away if you are not doing well or get worse. ? ?Thank you for choosing an e-visit. ? ?Your e-visit answers were reviewed by a board certified advanced clinical practitioner to complete your personal care plan. Depending upon the condition, your plan could have included both over the counter or prescription medications. ? ?Please review your pharmacy choice. Make sure the pharmacy is open so you can pick up prescription now. If there is a problem, you may contact your provider through [employer] and have the prescription routed to another pharmacy.  Your safety is important to [place]. If you have drug allergies check your prescription carefully.  ? ?For the next 24 hours you can use MyChart to ask questions about today's visit, request a non-urgent call back, or ask for a work or school excuse. ?You will get an email in the next two days asking about your experience. I hope that your e-visit has been valuable and will speed your recovery. ? ? ? ? ?

## 2021-04-29 ENCOUNTER — Other Ambulatory Visit (HOSPITAL_COMMUNITY): Payer: Self-pay

## 2021-04-29 MED ORDER — HYDROCODONE-ACETAMINOPHEN 5-325 MG PO TABS
ORAL_TABLET | ORAL | 0 refills | Status: AC
  Filled 2021-04-29: qty 20, 5d supply, fill #0

## 2022-05-02 DIAGNOSIS — R21 Rash and other nonspecific skin eruption: Secondary | ICD-10-CM | POA: Diagnosis not present

## 2022-05-02 DIAGNOSIS — M25561 Pain in right knee: Secondary | ICD-10-CM | POA: Diagnosis not present

## 2022-05-02 DIAGNOSIS — M25572 Pain in left ankle and joints of left foot: Secondary | ICD-10-CM | POA: Diagnosis not present

## 2022-05-02 DIAGNOSIS — S39012A Strain of muscle, fascia and tendon of lower back, initial encounter: Secondary | ICD-10-CM | POA: Diagnosis not present

## 2022-05-31 DIAGNOSIS — R21 Rash and other nonspecific skin eruption: Secondary | ICD-10-CM | POA: Diagnosis not present

## 2022-05-31 DIAGNOSIS — M25561 Pain in right knee: Secondary | ICD-10-CM | POA: Diagnosis not present

## 2022-05-31 DIAGNOSIS — Z6831 Body mass index (BMI) 31.0-31.9, adult: Secondary | ICD-10-CM | POA: Diagnosis not present

## 2022-06-01 ENCOUNTER — Other Ambulatory Visit: Payer: Self-pay | Admitting: Family Medicine

## 2022-06-01 DIAGNOSIS — M25561 Pain in right knee: Secondary | ICD-10-CM

## 2022-06-11 ENCOUNTER — Ambulatory Visit
Admission: RE | Admit: 2022-06-11 | Discharge: 2022-06-11 | Disposition: A | Discharge status: Home / Self Care | Payer: 59 | Source: Ambulatory Visit | Attending: Family Medicine | Admitting: Family Medicine

## 2022-06-11 DIAGNOSIS — M25561 Pain in right knee: Secondary | ICD-10-CM

## 2022-06-21 DIAGNOSIS — H47293 Other optic atrophy, bilateral: Secondary | ICD-10-CM | POA: Diagnosis not present

## 2022-06-21 DIAGNOSIS — H40003 Preglaucoma, unspecified, bilateral: Secondary | ICD-10-CM | POA: Diagnosis not present

## 2023-02-03 DIAGNOSIS — R059 Cough, unspecified: Secondary | ICD-10-CM | POA: Diagnosis not present

## 2023-02-03 DIAGNOSIS — J40 Bronchitis, not specified as acute or chronic: Secondary | ICD-10-CM | POA: Diagnosis not present

## 2023-02-03 DIAGNOSIS — J029 Acute pharyngitis, unspecified: Secondary | ICD-10-CM | POA: Diagnosis not present

## 2023-02-07 DIAGNOSIS — H40003 Preglaucoma, unspecified, bilateral: Secondary | ICD-10-CM | POA: Diagnosis not present

## 2023-02-07 DIAGNOSIS — H47293 Other optic atrophy, bilateral: Secondary | ICD-10-CM | POA: Diagnosis not present

## 2023-02-08 DIAGNOSIS — L280 Lichen simplex chronicus: Secondary | ICD-10-CM | POA: Diagnosis not present

## 2023-07-03 DIAGNOSIS — Z6831 Body mass index (BMI) 31.0-31.9, adult: Secondary | ICD-10-CM | POA: Diagnosis not present

## 2023-07-03 DIAGNOSIS — M25552 Pain in left hip: Secondary | ICD-10-CM | POA: Diagnosis not present

## 2023-07-17 DIAGNOSIS — M25561 Pain in right knee: Secondary | ICD-10-CM | POA: Diagnosis not present

## 2023-07-17 DIAGNOSIS — M25552 Pain in left hip: Secondary | ICD-10-CM | POA: Diagnosis not present

## 2023-11-19 DIAGNOSIS — Z0000 Encounter for general adult medical examination without abnormal findings: Secondary | ICD-10-CM | POA: Diagnosis not present

## 2023-11-19 DIAGNOSIS — Z1331 Encounter for screening for depression: Secondary | ICD-10-CM | POA: Diagnosis not present

## 2023-11-19 DIAGNOSIS — Z23 Encounter for immunization: Secondary | ICD-10-CM | POA: Diagnosis not present

## 2023-12-27 DIAGNOSIS — Z136 Encounter for screening for cardiovascular disorders: Secondary | ICD-10-CM | POA: Diagnosis not present

## 2023-12-27 DIAGNOSIS — I10 Essential (primary) hypertension: Secondary | ICD-10-CM | POA: Diagnosis not present
